# Patient Record
Sex: Female | Born: 1975 | Race: Black or African American | Hispanic: No | Marital: Married | Smoking: Never smoker
Health system: Southern US, Community
[De-identification: ages and names within clinical notes are randomized; demographics above are authoritative.]

## PROBLEM LIST (undated history)

## (undated) DIAGNOSIS — I10 Essential (primary) hypertension: Secondary | ICD-10-CM

---

## 2007-09-30 ENCOUNTER — Emergency Department (HOSPITAL_BASED_OUTPATIENT_CLINIC_OR_DEPARTMENT_OTHER): Admission: EM | Admit: 2007-09-30 | Discharge: 2007-09-30 | Payer: Self-pay | Admitting: Emergency Medicine

## 2007-12-24 ENCOUNTER — Emergency Department (HOSPITAL_BASED_OUTPATIENT_CLINIC_OR_DEPARTMENT_OTHER): Admission: EM | Admit: 2007-12-24 | Discharge: 2007-12-24 | Payer: Self-pay | Admitting: Emergency Medicine

## 2008-05-02 ENCOUNTER — Emergency Department (HOSPITAL_BASED_OUTPATIENT_CLINIC_OR_DEPARTMENT_OTHER): Admission: EM | Admit: 2008-05-02 | Discharge: 2008-05-02 | Payer: Self-pay | Admitting: Emergency Medicine

## 2008-05-02 ENCOUNTER — Ambulatory Visit: Payer: Self-pay | Admitting: Diagnostic Radiology
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
## 2010-06-18 IMAGING — CR DG ABDOMEN ACUTE W/ 1V CHEST
3 series · 3 of 3 positions shown · non-contrast
Comparison: None

CLINICAL DATA: Vomiting and diarrhea.  Fever.

ACUTE ABDOMEN SERIES (ABDOMEN 2 VIEW & CHEST 1 VIEW)

[w chest pa]
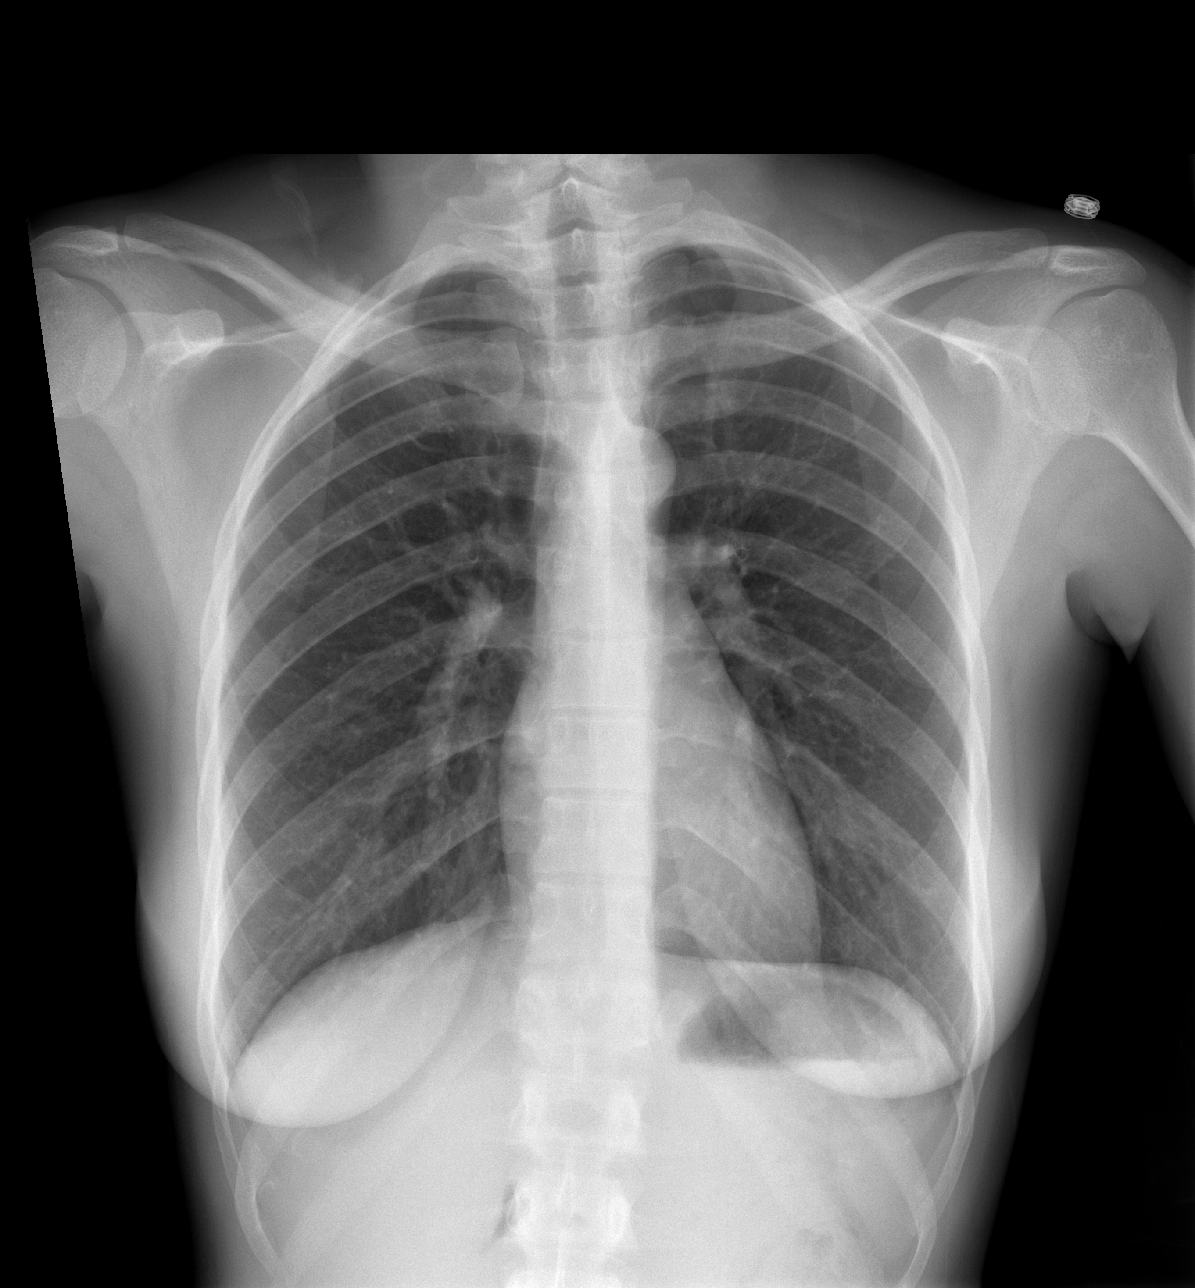

[w abdomen upright]
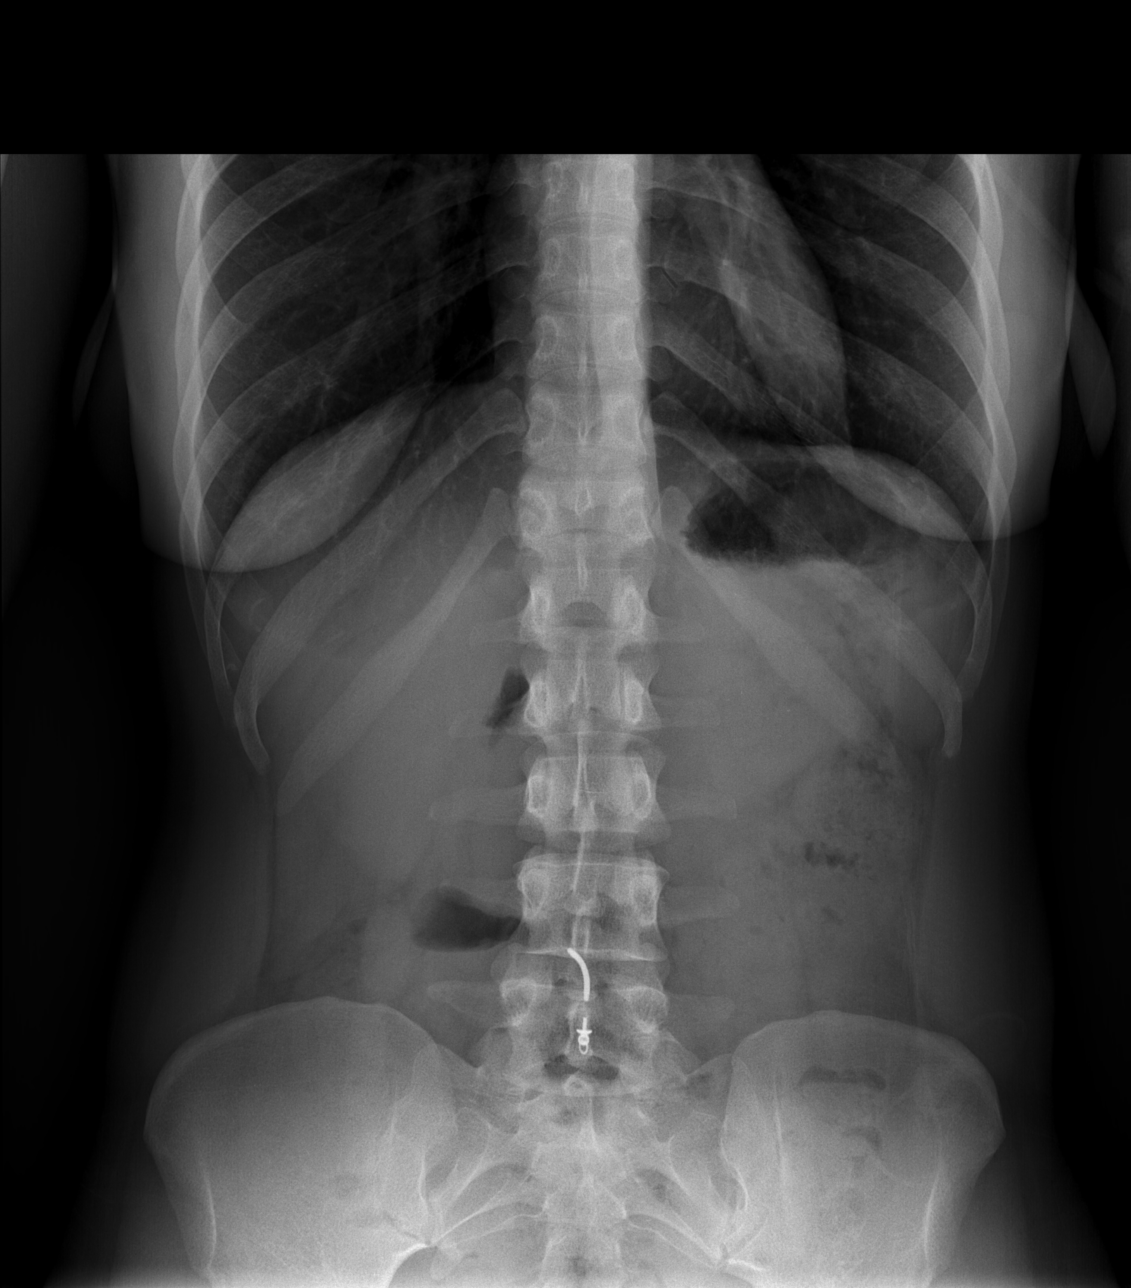

[t abdomen supine]
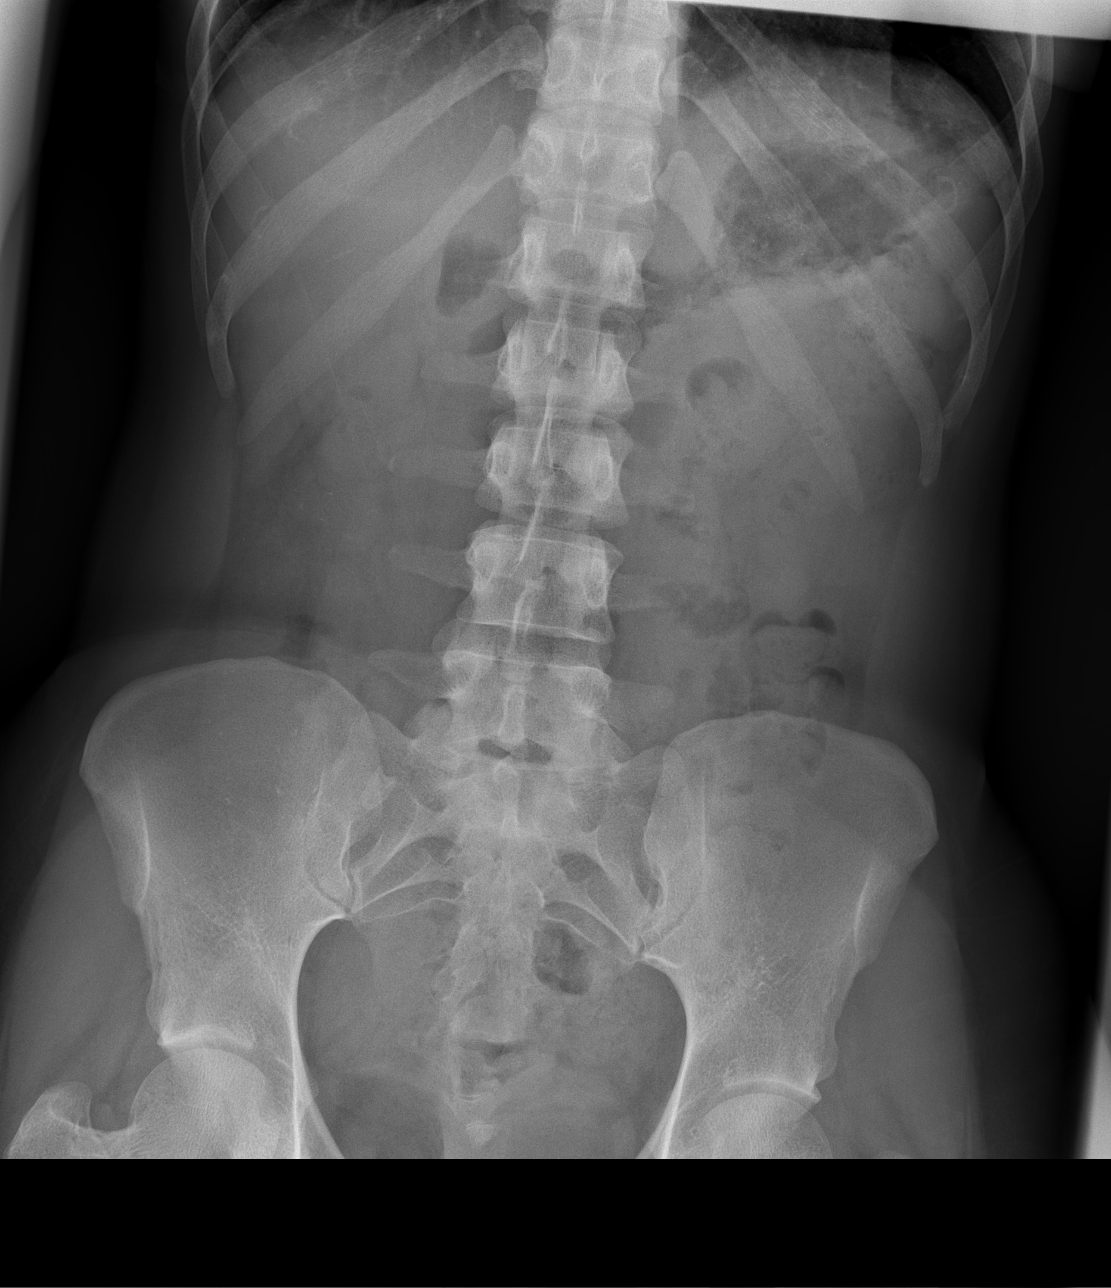

[3 of 3 positions shown; findings below may reference images not displayed]

FINDINGS: The lungs are clear and  there is no infiltrate or
effusion.

Bowel gas pattern is normal and there is no bowel obstruction or
free air.  Negative for renal calculus.  There is no significant
bony abnormality.
IMPRESSION: Negative

## 2010-08-03 LAB — URINALYSIS, ROUTINE W REFLEX MICROSCOPIC
Bilirubin Urine: NEGATIVE
Glucose, UA: 100 mg/dL — AB
Hgb urine dipstick: NEGATIVE
Specific Gravity, Urine: 1.027 (ref 1.005–1.030)
pH: 7.5 (ref 5.0–8.0)

## 2010-08-03 LAB — URINE MICROSCOPIC-ADD ON

## 2010-08-03 LAB — BASIC METABOLIC PANEL
BUN: 12 mg/dL (ref 6–23)
Calcium: 9.4 mg/dL (ref 8.4–10.5)
Chloride: 104 mEq/L (ref 96–112)
Creatinine, Ser: 0.9 mg/dL (ref 0.4–1.2)
GFR calc non Af Amer: >60 mL/min (ref >60)

## 2011-01-20 LAB — RAPID STREP SCREEN (MED CTR MEBANE ONLY): Streptococcus, Group A Screen (Direct): NEGATIVE

## 2012-07-08 ENCOUNTER — Emergency Department (HOSPITAL_BASED_OUTPATIENT_CLINIC_OR_DEPARTMENT_OTHER)
Admission: EM | Admit: 2012-07-08 | Discharge: 2012-07-09 | Disposition: A | Discharge status: Home / Self Care | Payer: 59 | Attending: Emergency Medicine | Admitting: Emergency Medicine

## 2012-07-08 ENCOUNTER — Encounter (HOSPITAL_BASED_OUTPATIENT_CLINIC_OR_DEPARTMENT_OTHER): Payer: Self-pay | Admitting: *Deleted

## 2012-07-08 DIAGNOSIS — Z3202 Encounter for pregnancy test, result negative: Secondary | ICD-10-CM | POA: Insufficient documentation

## 2012-07-08 DIAGNOSIS — J029 Acute pharyngitis, unspecified: Secondary | ICD-10-CM

## 2012-07-08 DIAGNOSIS — I10 Essential (primary) hypertension: Secondary | ICD-10-CM | POA: Insufficient documentation

## 2012-07-08 DIAGNOSIS — J3489 Other specified disorders of nose and nasal sinuses: Secondary | ICD-10-CM | POA: Insufficient documentation

## 2012-07-08 DIAGNOSIS — Z79899 Other long term (current) drug therapy: Secondary | ICD-10-CM | POA: Insufficient documentation

## 2012-07-08 HISTORY — DX: Essential (primary) hypertension: I10

## 2012-07-08 MED ORDER — NAPROXEN 375 MG PO TABS: 375.0000 mg | ORAL_TABLET | Freq: Two times a day (BID) | ORAL | Status: AC

## 2012-07-08 MED ORDER — LIDOCAINE VISCOUS 2 % MT SOLN
20.0000 mL | Freq: Once | OROMUCOSAL | Status: AC
  Administered 2012-07-09: 20 mL via OROMUCOSAL
  Filled 2012-07-08: qty 15

## 2012-07-08 NOTE — ED Notes (Signed)
Pt states she has had cold s/s, cough, sore throat since Tues.

## 2012-07-08 NOTE — ED Provider Notes (Signed)
History  This chart was scribed for Cheryl Kingdon Smitty Cords, MD scribed by Magnus Sinning. The patient was seen in room MH05/MH05 at 23:51    CSN: [id number]  Arrival date & time 07/08/12  2218     Chief Complaint  Patient presents with  . Sore Throat    (Consider location/radiation/quality/duration/timing/severity/associated sxs/prior treatment) Patient is a 37 y.o. female presenting with pharyngitis. The history is provided by the patient. No language interpreter was used.  Sore Throat This is a new problem. The current episode started more than 2 days ago. The problem occurs constantly. The problem has not changed since onset.Pertinent negatives include no chest pain and no shortness of breath. Nothing aggravates the symptoms. Nothing relieves the symptoms. She has tried nothing for the symptoms. The treatment provided no relief.   Cheryl Lozano is a 37 y.o. female who presents to the Emergency Department complaining of constant moderate ST with associated laryngitis and nasal congestion, all onset two days ago.  The patient denies any runny nose or fever.  Past Medical History  Diagnosis Date  . Hypertension     Past Surgical History  Procedure Laterality Date  . Cesarean section      History reviewed. No pertinent family history.  History  Substance Use Topics  . Smoking status: Never Smoker   . Smokeless tobacco: Not on file  . Alcohol Use: No    Review of Systems  Constitutional: Negative for fever.  HENT: Positive for congestion and sore throat. Negative for drooling, trouble swallowing, neck pain and neck stiffness.   Respiratory: Positive for cough. Negative for shortness of breath.   Cardiovascular: Negative for chest pain.  All other systems reviewed and are negative.    Allergies  Vicodin  Home Medications   Current Outpatient Rx  Name  Route  Sig  Dispense  Refill  . amLODipine (NORVASC) 5 MG tablet   Oral   Take 5 mg by mouth daily.            BP 136/94  Pulse 84  Temp(Src) 97.8 F (36.6 C) (Oral)  Resp 20  Ht 5\' 4"  (1.626 m)  Wt 145 lb (65.772 kg)  BMI 24.88 kg/m2  SpO2 100%  LMP 06/17/2012  Physical Exam  Nursing note and vitals reviewed. Constitutional: She is oriented to person, place, and time. She appears well-developed and well-nourished. No distress.  HENT:  Head: Normocephalic and atraumatic.  Mouth/Throat: Uvula is midline and oropharynx is clear and moist. No oropharyngeal exudate.  Tonsils not enlarged  Eyes: Conjunctivae and EOM are normal. Pupils are equal, round, and reactive to light.  Neck: Normal range of motion. Neck supple. No tracheal deviation present.  No lymph nodes in the neck. Trachea is midline.  No pain with displacement of the trachea  Cardiovascular: Normal rate, regular rhythm, normal heart sounds and intact distal pulses.   No murmur heard. Pulmonary/Chest: Effort normal. No respiratory distress. She has no wheezes. She has no rales.  Abdominal: Soft. Bowel sounds are normal. She exhibits no distension. There is no tenderness.  Musculoskeletal: Normal range of motion.  Neurological: She is alert and oriented to person, place, and time. No sensory deficit.  Skin: Skin is warm and dry.  Psychiatric: She has a normal mood and affect. Her behavior is normal.    ED Course  Procedures (including critical care time) DIAGNOSTIC STUDIES: Oxygen Saturation is 100% room air, adequate by my interpretation.    COORDINATION OF CARE: 23:54: Physical exam performed.  Labs Reviewed  RAPID STREP SCREEN  PREGNANCY, URINE   No results found.   No diagnosis found.    MDM  Viral layngitis and pharyngitis.  Will treat symptomatically  I personally performed the services described in this documentation, which was scribed in my presence. The recorded information has been reviewed and is accurate.          Jasmine Awe, MD 07/09/12 [phone]

## 2020-03-22 ENCOUNTER — Encounter (HOSPITAL_BASED_OUTPATIENT_CLINIC_OR_DEPARTMENT_OTHER): Payer: Self-pay | Admitting: Emergency Medicine

## 2020-03-22 ENCOUNTER — Other Ambulatory Visit: Payer: Self-pay

## 2020-03-22 ENCOUNTER — Emergency Department (HOSPITAL_BASED_OUTPATIENT_CLINIC_OR_DEPARTMENT_OTHER)
Admission: EM | Admit: 2020-03-22 | Discharge: 2020-03-22 | Disposition: A | Discharge status: Home / Self Care | Payer: 59 | Attending: Emergency Medicine | Admitting: Emergency Medicine

## 2020-03-22 DIAGNOSIS — I10 Essential (primary) hypertension: Secondary | ICD-10-CM | POA: Insufficient documentation

## 2020-03-22 DIAGNOSIS — R112 Nausea with vomiting, unspecified: Secondary | ICD-10-CM | POA: Insufficient documentation

## 2020-03-22 DIAGNOSIS — R1110 Vomiting, unspecified: Secondary | ICD-10-CM | POA: Diagnosis not present

## 2020-03-22 DIAGNOSIS — R42 Dizziness and giddiness: Secondary | ICD-10-CM | POA: Diagnosis present

## 2020-03-22 LAB — CBC
HCT: 34.6 % — ABNORMAL LOW (ref 36.0–46.0)
Hemoglobin: 10.6 g/dL — ABNORMAL LOW (ref 12.0–15.0)
MCH: 22.1 pg — ABNORMAL LOW (ref 26.0–34.0)
MCHC: 30.6 g/dL (ref 30.0–36.0)
MCV: 72.1 fL — ABNORMAL LOW (ref 80.0–100.0)
Platelets: 394 10*3/uL (ref 150–400)
RBC: 4.8 MIL/uL (ref 3.87–5.11)
RDW: 17.2 % — ABNORMAL HIGH (ref 11.5–15.5)
WBC: 9.2 10*3/uL (ref 4.0–10.5)
nRBC: 0 % (ref 0.0–0.2)

## 2020-03-22 LAB — URINALYSIS, ROUTINE W REFLEX MICROSCOPIC
Bilirubin Urine: NEGATIVE
Glucose, UA: >=500 mg/dL — AB
Hgb urine dipstick: NEGATIVE
Ketones, ur: 15 mg/dL — AB
Leukocytes,Ua: NEGATIVE
Nitrite: NEGATIVE
Protein, ur: 30 mg/dL — AB
Specific Gravity, Urine: 1.02 (ref 1.005–1.030)
pH: 7.5 (ref 5.0–8.0)

## 2020-03-22 LAB — COMPREHENSIVE METABOLIC PANEL
ALT: 17 U/L (ref 0–44)
AST: 23 U/L (ref 15–41)
Albumin: 4.1 g/dL (ref 3.5–5.0)
Alkaline Phosphatase: 97 U/L (ref 38–126)
Anion gap: 11 (ref 5–15)
BUN: 12 mg/dL (ref 6–20)
CO2: 22 mmol/L (ref 22–32)
Calcium: 9.2 mg/dL (ref 8.9–10.3)
Chloride: 102 mmol/L (ref 98–111)
Creatinine, Ser: 0.83 mg/dL (ref 0.44–1.00)
GFR, Estimated: >60 mL/min (ref >60)
Glucose, Bld: 123 mg/dL — ABNORMAL HIGH (ref 70–99)
Potassium: 3.6 mmol/L (ref 3.5–5.1)
Sodium: 135 mmol/L (ref 135–145)
Total Bilirubin: 0.8 mg/dL (ref 0.3–1.2)
Total Protein: 8.2 g/dL — ABNORMAL HIGH (ref 6.5–8.1)

## 2020-03-22 LAB — URINALYSIS, MICROSCOPIC (REFLEX)

## 2020-03-22 LAB — LIPASE, BLOOD: Lipase: 31 U/L (ref 11–51)

## 2020-03-22 LAB — PREGNANCY, URINE: Preg Test, Ur: NEGATIVE

## 2020-03-22 MED ORDER — MECLIZINE HCL 25 MG PO TABS: 25.0000 mg | ORAL_TABLET | Freq: Three times a day (TID) | ORAL | 0 refills | Status: AC | PRN

## 2020-03-22 MED ORDER — ONDANSETRON 4 MG PO TBDP: 4.0000 mg | ORAL_TABLET | Freq: Three times a day (TID) | ORAL | 0 refills | Status: AC | PRN

## 2020-03-22 MED ORDER — ONDANSETRON 4 MG PO TBDP
4.0000 mg | ORAL_TABLET | Freq: Once | ORAL | Status: AC
  Administered 2020-03-22: 4 mg via ORAL
  Filled 2020-03-22: qty 1

## 2020-03-22 MED ORDER — SODIUM CHLORIDE 0.9 % IV BOLUS
1000.0000 mL | Freq: Once | INTRAVENOUS | Status: AC
  Administered 2020-03-22: 1000 mL via INTRAVENOUS

## 2020-03-22 MED ORDER — ONDANSETRON 4 MG PO TBDP
4.0000 mg | ORAL_TABLET | Freq: Once | ORAL | Status: AC | PRN
  Administered 2020-03-22: 4 mg via ORAL
  Filled 2020-03-22: qty 1

## 2020-03-22 MED ORDER — PROMETHAZINE HCL 25 MG/ML IJ SOLN
12.5000 mg | Freq: Once | INTRAMUSCULAR | Status: AC
  Administered 2020-03-22: 12.5 mg via INTRAVENOUS
  Filled 2020-03-22: qty 1

## 2020-03-22 MED ORDER — LORAZEPAM 2 MG/ML IJ SOLN
1.0000 mg | Freq: Once | INTRAMUSCULAR | Status: AC
  Administered 2020-03-22: 1 mg via INTRAVENOUS
  Filled 2020-03-22: qty 1

## 2020-03-22 MED ORDER — MECLIZINE HCL 25 MG PO TABS
25.0000 mg | ORAL_TABLET | Freq: Once | ORAL | Status: AC
  Administered 2020-03-22: 25 mg via ORAL
  Filled 2020-03-22: qty 1

## 2020-03-22 NOTE — ED Triage Notes (Signed)
Pt arrives pov with spouse with c/o nausea and vomiting. Endorses dizziness when awakening, symptoms resolved.

## 2020-03-22 NOTE — ED Notes (Signed)
Tolerating PO fluids prior to phenergan administration.

## 2020-03-22 NOTE — ED Provider Notes (Signed)
[facility] EMERGENCY DEPARTMENT Provider Note   CSN: [id number] Arrival date & time: 03/22/20  1523     History Chief Complaint  Patient presents with  . Emesis    Cheryl Lozano is a 44 y.o. female.  Pt presents to the ED today with dizziness.  The pt said she woke up this morning feeling very dizzy.  She felt like the room was spinning.  She said it is worse when she moves her head.  She has had n/v as well.  She has never had this in the past.        Past Medical History:  Diagnosis Date  . Hypertension     There are no problems to display for this patient.   Past Surgical History:  Procedure Laterality Date  . CESAREAN SECTION       OB History   No obstetric history on file.     History reviewed. No pertinent family history.  Social History   Tobacco Use  . Smoking status: Never Smoker  Substance Use Topics  . Alcohol use: No  . Drug use: No    Home Medications Prior to Admission medications   Medication Sig Start Date End Date Taking? Authorizing Provider  amLODipine (NORVASC) 5 MG tablet Take 5 mg by mouth daily.    [provider]  meclizine (ANTIVERT) 25 MG tablet Take 1 tablet (25 mg total) by mouth 3 (three) times daily as needed for dizziness. 03/22/20   Jacalyn Lefevre, MD  naproxen (NAPROSYN) 375 MG tablet Take 1 tablet (375 mg total) by mouth 2 (two) times daily. 07/08/12   Palumbo, April, MD  ondansetron (ZOFRAN ODT) 4 MG disintegrating tablet Take 1 tablet (4 mg total) by mouth every 8 (eight) hours as needed for nausea or vomiting. 03/22/20   Jacalyn Lefevre, MD    Allergies    Vicodin [hydrocodone-acetaminophen]  Review of Systems   Review of Systems  Gastrointestinal: Positive for nausea and vomiting.  Neurological: Positive for dizziness.  All other systems reviewed and are negative.   Physical Exam Updated Vital Signs BP 131/73   Pulse 88   Temp 98 F (36.7 C) (Oral)   Resp 16   Ht 5\' 4"  (1.626  m)   Wt 70.3 kg   LMP 03/15/2020   SpO2 97%   BMI 26.61 kg/m   Physical Exam Vitals and nursing note reviewed.  Constitutional:      Appearance: Normal appearance.  HENT:     Head: Normocephalic and atraumatic.     Right Ear: External ear normal.     Left Ear: External ear normal.     Nose: Nose normal.     Mouth/Throat:     Mouth: Mucous membranes are moist.     Pharynx: Oropharynx is clear.  Eyes:     Extraocular Movements: Extraocular movements intact.     Conjunctiva/sclera: Conjunctivae normal.     Pupils: Pupils are equal, round, and reactive to light.  Cardiovascular:     Rate and Rhythm: Normal rate and regular rhythm.     Pulses: Normal pulses.     Heart sounds: Normal heart sounds.  Pulmonary:     Effort: Pulmonary effort is normal.     Breath sounds: Normal breath sounds.  Abdominal:     General: Abdomen is flat. Bowel sounds are normal.     Palpations: Abdomen is soft.  Musculoskeletal:        General: Normal range of motion.  Cervical back: Normal range of motion and neck supple.  Skin:    General: Skin is warm.     Capillary Refill: Capillary refill takes less than 2 seconds.  Neurological:     General: No focal deficit present.     Mental Status: She is alert and oriented to person, place, and time.     Comments: Dizziness with head movement  Psychiatric:        Mood and Affect: Mood normal.        Behavior: Behavior normal.     ED Results / Procedures / Treatments   Labs (all labs ordered are listed, but only abnormal results are displayed) Labs Reviewed  COMPREHENSIVE METABOLIC PANEL - Abnormal; Notable for the following components:      Result Value   Glucose, Bld 123 (*)    Total Protein 8.2 (*)    All other components within normal limits  CBC - Abnormal; Notable for the following components:   Hemoglobin 10.6 (*)    HCT 34.6 (*)    MCV 72.1 (*)    MCH 22.1 (*)    RDW 17.2 (*)    All other components within normal limits    URINALYSIS, ROUTINE W REFLEX MICROSCOPIC - Abnormal; Notable for the following components:   Glucose, UA >=500 (*)    Ketones, ur 15 (*)    Protein, ur 30 (*)    All other components within normal limits  URINALYSIS, MICROSCOPIC (REFLEX) - Abnormal; Notable for the following components:   Bacteria, UA MANY (*)    All other components within normal limits  LIPASE, BLOOD  PREGNANCY, URINE    EKG None  Radiology No results found.  Procedures Procedures (including critical care time)  Medications Ordered in ED Medications  LORazepam (ATIVAN) injection 1 mg (has no administration in time range)  ondansetron (ZOFRAN-ODT) disintegrating tablet 4 mg (4 mg Oral Given 03/22/20 1541)  ondansetron (ZOFRAN-ODT) disintegrating tablet 4 mg (4 mg Oral Given 03/22/20 2040)  sodium chloride 0.9 % bolus 1,000 mL (1,000 mLs Intravenous New Bag/Given 03/22/20 2247)  meclizine (ANTIVERT) tablet 25 mg (25 mg Oral Given 03/22/20 2252)  promethazine (PHENERGAN) injection 12.5 mg (12.5 mg Intravenous Given 03/22/20 2252)    ED Course  I have reviewed the triage vital signs and the nursing notes.  Pertinent labs & imaging results that were available during my care of the patient were reviewed by me and considered in my medical decision making (see chart for details).    MDM Rules/Calculators/A&P                         Pt is given IVFs, phenergan, and antivert.  She is tolerating pos.  She is feeling better, but is still dizzy.  She will be given a dose of ativan and d/c home.  She knows to return if worse.  F/u with pcp.  Final Clinical Impression(s) / ED Diagnoses Final diagnoses:  Vertigo    Rx / DC Orders ED Discharge Orders         Ordered    meclizine (ANTIVERT) 25 MG tablet  3 times daily PRN        03/22/20 2312    ondansetron (ZOFRAN ODT) 4 MG disintegrating tablet  Every 8 hours PRN        03/22/20 2312           Jacalyn Lefevre, MD 03/22/20 2336

## 2020-03-23 ENCOUNTER — Telehealth (HOSPITAL_BASED_OUTPATIENT_CLINIC_OR_DEPARTMENT_OTHER): Payer: Self-pay | Admitting: Emergency Medicine
